# Patient Record
Sex: Male | Born: 2000 | Race: White | Hispanic: Yes | Marital: Single | Smoking: Never smoker
Health system: Southern US, Community
[De-identification: ages and names within clinical notes are randomized; demographics above are authoritative.]

## PROBLEM LIST (undated history)

## (undated) DIAGNOSIS — R030 Elevated blood-pressure reading, without diagnosis of hypertension: Secondary | ICD-10-CM

## (undated) DIAGNOSIS — J302 Other seasonal allergic rhinitis: Secondary | ICD-10-CM

## (undated) DIAGNOSIS — R079 Chest pain, unspecified: Secondary | ICD-10-CM

## (undated) DIAGNOSIS — M79603 Pain in arm, unspecified: Secondary | ICD-10-CM

## (undated) HISTORY — DX: Elevated blood-pressure reading, without diagnosis of hypertension: R03.0

## (undated) HISTORY — DX: Chest pain, unspecified: R07.9

## (undated) HISTORY — DX: Morbid (severe) obesity due to excess calories: E66.01

## (undated) HISTORY — DX: Pain in arm, unspecified: M79.603

---
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
## 2022-02-03 ENCOUNTER — Telehealth (INDEPENDENT_AMBULATORY_CARE_PROVIDER_SITE_OTHER): Payer: Self-pay | Admitting: Pediatrics

## 2022-02-03 NOTE — Telephone Encounter (Signed)
Who's calling (name and relationship to patient) : Sharlot Gowda; mom  Best contact number: [phone]  Provider they see: Dr. Quincy Sheehan  Reason for call: Mom wanted to know if Iasiah needs blood work done before the appt. She has requested a call back.   Call ID:      PRESCRIPTION REFILL ONLY  Name of prescription:  Pharmacy:

## 2022-02-04 ENCOUNTER — Ambulatory Visit (INDEPENDENT_AMBULATORY_CARE_PROVIDER_SITE_OTHER): Payer: Medicaid Other | Admitting: Pediatrics

## 2022-03-01 ENCOUNTER — Ambulatory Visit (HOSPITAL_COMMUNITY)
Admission: RE | Admit: 2022-03-01 | Discharge: 2022-03-01 | Disposition: A | Discharge status: Home / Self Care | Payer: Medicaid Other | Source: Ambulatory Visit | Attending: Pediatrics | Admitting: Pediatrics

## 2022-03-01 DIAGNOSIS — R0789 Other chest pain: Secondary | ICD-10-CM | POA: Insufficient documentation

## 2022-03-01 DIAGNOSIS — M7989 Other specified soft tissue disorders: Secondary | ICD-10-CM | POA: Insufficient documentation

## 2022-03-01 MED ORDER — GADOBUTROL 1 MMOL/ML IV SOLN
14.0000 mL | Freq: Once | INTRAVENOUS | Status: AC | PRN
  Administered 2022-03-01: 14 mL via INTRAVENOUS

## 2022-03-13 LAB — LIPID PANEL
Cholesterol: 154 mg/dL (ref <170)
HDL: 42 mg/dL — ABNORMAL LOW (ref >45)
LDL Cholesterol (Calc): 91 mg/dL (calc) (ref <110)
Non-HDL Cholesterol (Calc): 112 mg/dL (calc) (ref <120)
Total CHOL/HDL Ratio: 3.7 (calc) (ref <5.0)
Triglycerides: 112 mg/dL — ABNORMAL HIGH (ref <90)

## 2022-03-13 LAB — HEPATIC FUNCTION PANEL
AG Ratio: 1.7 (calc) (ref 1.0–2.5)
ALT: 56 U/L — ABNORMAL HIGH (ref 8–46)
AST: 26 U/L (ref 12–32)
Albumin: 4.3 g/dL (ref 3.6–5.1)
Alkaline phosphatase (APISO): 75 U/L (ref 46–169)
Bilirubin, Direct: 0.1 mg/dL (ref 0.0–0.2)
Globulin: 2.6 g/dL (calc) (ref 2.1–3.5)
Indirect Bilirubin: 0.3 mg/dL (calc) (ref 0.2–1.1)
Total Bilirubin: 0.4 mg/dL (ref 0.2–1.1)
Total Protein: 6.9 g/dL (ref 6.3–8.2)

## 2022-03-18 ENCOUNTER — Ambulatory Visit (INDEPENDENT_AMBULATORY_CARE_PROVIDER_SITE_OTHER): Payer: Medicaid Other | Admitting: Pediatrics

## 2022-03-18 ENCOUNTER — Encounter (INDEPENDENT_AMBULATORY_CARE_PROVIDER_SITE_OTHER): Payer: Self-pay | Admitting: Pediatrics

## 2022-03-18 VITALS — BP 128/80 | HR 96 | Wt 312.4 lb

## 2022-03-18 DIAGNOSIS — E781 Pure hyperglyceridemia: Secondary | ICD-10-CM | POA: Diagnosis not present

## 2022-03-18 DIAGNOSIS — R7401 Elevation of levels of liver transaminase levels: Secondary | ICD-10-CM | POA: Diagnosis not present

## 2022-03-18 NOTE — Patient Instructions (Addendum)
Latest Reference Range & Units 03/12/22 08:22  AG Ratio 1.0 - 2.5 (calc) 1.7  AST 12 - 32 U/L 26  ALT 8 - 46 U/L 56 (H)  Total Protein 6.3 - 8.2 g/dL 6.9  Bilirubin, Direct 0.0 - 0.2 mg/dL 0.1  Indirect Bilirubin 0.2 - 1.1 mg/dL (calc) 0.3  Total Bilirubin 0.2 - 1.1 mg/dL 0.4  Total CHOL/HDL Ratio <5.0 (calc) 3.7  Cholesterol <170 mg/dL 161  HDL Cholesterol >09 mg/dL 42 (L)  LDL Cholesterol (Calc) <110 mg/dL (calc) 91  Non-HDL Cholesterol (Calc) <120 mg/dL (calc) 604  Triglycerides <90 mg/dL 540 (H)  Alkaline phosphatase (APISO) 46 - 169 U/L 75  Globulin 2.1 - 3.5 g/dL (calc) 2.6  (H): Data is abnormally high (L): Data is abnormally low   Your ALT has decreased from 65 to 56 mg/dL, and HDL improved from 40 to 42. You are doing good. Keep working to normalize the ALT and normalize the Triglycerides.  If your next 6labs are normal at the next visit, you don't need to follow up with Dr. Quincy Sheehan.

## 2022-03-18 NOTE — Progress Notes (Signed)
Pediatric Endocrinology Consultation Follow-up Visit  [place] 2001-01-15 [id number]   HPI: Jeremy Woods  is a 21 y.o. male presenting for follow-up of mixed hyperlipidemia, elevated LFTs, and elevated BMI.  Jeremy Woods established care with this practice 04/23/21, and lifestyle changes were recommended. He last met with our dietician 11/10/21. He was unaccompanied and no interpretor was needed.  Jeremy Woods was last seen at [place] [place] on 08/04/21.  Since last visit, he has lost 5 pounds. 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   3. ROS: Greater than 10 systems reviewed with pertinent positives listed in HPI, otherwise neg.  The following portions of the patient's history were reviewed and updated as appropriate:  Past Medical History:   Past Medical History:  Diagnosis Date   Arm pain    Borderline systolic HTN    Chest pain    Morbidly obese (HCC)    Seasonal allergies     Meds: Outpatient Encounter Medications as of 03/18/2022  Medication Sig   [DISCONTINUED] calcium carbonate (TUMS - DOSED IN MG ELEMENTAL CALCIUM) 500 MG chewable tablet Chew 1 tablet by mouth daily.   [DISCONTINUED] cetirizine (ZYRTEC) 10 MG tablet Take 10 mg by mouth daily.   [DISCONTINUED] hydrocortisone cream 1 % apply to the affected area(s) by topical route 2 times per day for 7 days   [DISCONTINUED] metoprolol tartrate (LOPRESSOR) 100 MG tablet Take 1 tablet by mouth 2 hours before CT   [DISCONTINUED] naproxen (NAPROSYN) 375 MG tablet Take 375 mg by mouth 2 (two) times daily.   [DISCONTINUED] polyethylene glycol (MIRALAX / GLYCOLAX) 17 g packet dissolve as directed 1 packet and take by oral route daily as needed   No facility-administered encounter medications on file as of 03/18/2022.    Allergies: No Known Allergies  Surgical History: History reviewed. No pertinent surgical history.   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 Blood pressure %iles are not available for patients who are 18 years or older.  Wt Readings from Last 3 Encounters:  03/18/22 (!) 312 lb 6.4 oz (141.7 kg) (>99 %, Z= 3.17)*  11/10/21 (!) 317 lb 12.8 oz (144.2 kg) (>99 %, Z= 3.21)*  08/04/21 (!) 321 lb 6.4 oz (145.8 kg) (>99 %, Z= 3.24)*   * Growth percentiles are based on CDC (Boys, 2-20 Years) data.   Ht Readings from Last 3 Encounters:  11/10/21 6' 0.35" (1.838 m) (85 %, Z= 1.03)*  04/07/21 6' 0.24" (1.835 m) (85 %, Z= 1.03)*   * Growth percentiles are based on CDC (Boys, 2-20 Years) data.  Physical Exam Vitals reviewed.  Constitutional:      Appearance: Normal appearance.  HENT:     Head: Normocephalic and atraumatic.     Nose: Nose normal.     Mouth/Throat:     Mouth: Mucous membranes are moist.  Eyes:     Extraocular Movements: Extraocular movements intact.  Pulmonary:     Effort: Pulmonary effort is normal. No respiratory distress.  Abdominal:     General: There is no distension.  Musculoskeletal:        General: Normal range of motion.     Cervical back: Normal range of motion and neck supple.  Skin:    General: Skin is warm.     Findings: No rash.  Neurological:     General: No focal deficit present.      Mental Status: He is alert.  Psychiatric:        Mood and Affect: Mood normal.        Behavior: Behavior normal.      Labs: Results for orders placed or performed in visit on 11/20/21  ECHOCARDIOGRAM COMPLETE  Result Value Ref Range   Area-P 1/2 4.15 cm2   S' Lateral 3.00 cm    Assessment/Plan: Jeremy Woods is a 21 y.o. male with The primary encounter diagnosis was Elevated ALT measurement. Diagnoses of Hypertriglyceridemia and Morbid obesity ([place]) were also pertinent to this visit.   1. Elevated ALT measurement -likely secondary to NAFLD -improving with lifestyle changes -ALT improved from 65 to 56 - Comprehensive metabolic panel before next visit  2. Hypertriglyceridemia -stable, but triglycerides slightly higher than last check -HDL is improving - Fasting Lipid panel before next visit  3. Morbid obesity ([place]) -again lost weight  -BMI improved from 45 to 42 -encouraged to keep up the good progress   Orders Placed This Encounter  Procedures   Lipid panel   Comprehensive metabolic panel    No orders of the defined types were placed in this encounter.     Follow-up:   Return in about 6 months (around 09/16/2022), or if symptoms worsen or fail to improve, for follow up and review of labs. If next set of labs normal, will return him to the care of his pediatrician.  Medical decision-making:  I spent 30 minutes dedicated to the care of this patient on the date of this encounter to include pre-visit review of labs/imaging/other provider notes, medically appropriate exam, face-to-face time with the patient, ordering of testing, and documenting in the EHR.   Thank you for the opportunity to participate in the care of your patient. Please do not hesitate to contact me should you have any questions regarding the assessment or treatment plan.   Sincerely,   Al Corpus, MD

## 2022-04-27 NOTE — Progress Notes (Incomplete)
   Medical Nutrition Therapy - Progress Note Appt start time: *** Appt end time: *** Reason for referral: Obesity Referring provider: Dr. Leana Roe - Endo Pertinent medical hx: acanthosis nigricans, obesity, vitamin D deficiency, hyperlipidemia, elevated ALT measurement  Assessment: Food allergies: none Pertinent Medications: see medication list  Vitamins/Supplements: none Pertinent labs:  (9/7) Hepatic Function Panel:ALT - 56 (high) (9/7) Lipid Panel: HDL - 42 (low), TG - 112 (high) (4/3) BMP, CBC - WNL (10/3) POCT Glucose: 108 (high) (10/3) POCT Hgb A1c: 5.2 (WNL) (10/4) HDL: 34 (low), TG: 120 (high), Cholesterol: 137 (WNL) (10/4) AST: 40 (high), ALT: 115 (high) (10/4) Vitamin D: 19 (deficient)  No anthropometrics taken on *** to prevent focus on weight for appointment. Most recent anthropometrics 6/2 were used to determine dietary needs.   (6/2) Anthropometrics: The child was weighed, measured, and plotted on the CDC growth chart. Ht: 179 cm (63.69 %)  Z-score: 0.35 Wt: 143.8 kg (99.93 %) Z-score: 3.20 BMI: 44.8 (99.87 %)  Z-score: 3.02   152% of 95th% IBW based on BMI @ 85th%: 83.3 kg  Estimated minimum caloric needs: 20 kcal/kg/day (DRI x IBW) Estimated minimum protein needs: 0.8 g/kg/day (DRI) Estimated minimum fluid needs: 19 mL/kg/day (Holliday Segar based on IBW)  Primary concerns today: Follow-up given pt with obesity. *** accompanied pt to appt today.  Dietary Intake Hx:  Current feeding behaviors: scheduled meals/snacks  Usual eating pattern includes: 3 meals and 1-2 snacks per day.  Snacking after bed: None Sneaking food: none Meal location: kitchen table Family meals: breakfast, dinner together Is everyone served the same meal: yes  Electronics present at meal times: yes, phones Preferred foods: chicken, steak, fruits, broccoli Avoided foods: oatmeal, strawberries Fast-food/eating out: 1-2x/week ([place] - honey spicy chicken sandwich; [employer]; [place] food)  24-hr recall:  Breakfast: Snack:  Lunch: Snack:  Dinner: Snack:  Typical Snacks: apples, oranges, fruit *** Typical Beverages: water, fat-free milk, rarely juice (6 oz), soda (1x/week), coffee w/ 1/4 cup creamer (daily) ***  Notes: ***  Changes made: *** Cut out ranch dressing  Decreased bread consumption  Decrease sugar sweetened beverages (juice, lemonade) - previously drinking 2 glasses per day  Stopped eating after dinner Decreased consumption of sugary foods  Eating more consistently - no meal skipping  Physical Activity: workout (cardio and weights) 4x/week, walking daily (30-45 minutes) ***  GI: constipation, some improvement - taking Miralax   Estimated current intake likely exceeding needs given obesity status. ***  Pt consuming various food groups. Pt likely consuming inadequate amounts of fruits and vegetables. ***  Nutrition Diagnosis: (***) Class 3 Obesity related to hx of excess caloric consumption as evidenced by BMI 152% of 95th percentile. (***) Altered nutrition-related laboratory values (POCT Glucose, HDL, TG, ALT) related to hx of excessive energy intake and lack of physical activity as evidenced by lab values above. ***  Intervention: Discussed pt's current intake. Discussed recommendations below. All questions answered, family in agreement with plan.   Nutrition Recommendations: - ***  Keep up the good work!   Handouts Given:  - ***  Handouts Given at Previous Appointments: - Heart Healthy MyPlate Planner  - Hand Serving Size  - Carbohydrates vs Noncarbohydrates - GG Healthy Recipes  - Types of Fats  Teach back method used.  Monitoring/Evaluation: Continue to Monitor: - Growth trends - Dietary intake - Physical activity - Lab values  Follow-up in ***.  Total time spent in counseling: *** minutes.

## 2022-05-11 ENCOUNTER — Ambulatory Visit (INDEPENDENT_AMBULATORY_CARE_PROVIDER_SITE_OTHER): Payer: Medicaid Other | Admitting: Dietician

## 2022-09-15 NOTE — Progress Notes (Unsigned)
Pediatric Endocrinology Consultation Follow-up Visit  [place] 2000-11-01 [id number]   HPI: Jeremy Woods  is a 22 y.o. male presenting for follow-up of mixed hyperlipidemia, elevated LFTs, and elevated BMI.  Jeremy Woods established care with this practice 04/23/21, and lifestyle changes were recommended. Jeremy Woods last met with our dietician 11/10/21. Jeremy Woods was unaccompanied and no interpretor was needed.  Jeremy Woods was last seen at [facility] on 03/18/22.  Since last visit, Jeremy Woods has gained 13 pounds.   ROS: Greater than 10 systems reviewed with pertinent positives listed in HPI, otherwise neg.  The following portions of the patient's history were reviewed and updated as appropriate:  Past Medical History:   Past Medical History:  Diagnosis Date  . Arm pain   . Borderline systolic HTN   . Chest pain   . Morbidly obese ([place])   . Seasonal allergies     Meds: Outpatient Encounter Medications as of 09/16/2022  Medication Sig  . ibuprofen (ADVIL) 600 MG tablet Take 600 mg by mouth every 6 (six) hours as needed. (Patient not taking: Reported on 09/16/2022)   No facility-administered encounter medications on file as of 09/16/2022.    Allergies: No Known Allergies  Surgical History: History reviewed. No pertinent surgical history.   Family History:  Family History  Problem Relation Age of Onset  . Healthy Mother   . Hypertension Maternal Grandmother   . Thyroid disease Maternal Grandmother   . Hypertension Maternal Grandfather   . Diabetes Paternal Grandmother   . 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 Blood pressure %iles are not available for patients who are 18 years or older.  Wt Readings from Last 3 Encounters:  09/16/22 (!) 325 lb 6.4 oz (147.6 kg) (>99 %, Z= 3.32)*  03/18/22 (!) 312 lb 6.4 oz (141.7 kg) (>99 %, Z= 3.17)*  11/10/21 (!) 317 lb 12.8 oz (144.2 kg) (>99 %, Z= 3.21)*   * Growth percentiles are based on CDC (Boys, 2-20 Years) data.   Ht Readings from Last 3 Encounters:  11/10/21 6' 0.35" (1.838 m) (85 %, Z= 1.03)*  04/07/21 6' 0.24" (1.835 m) (85 %, Z= 1.03)*   * Growth percentiles are based on CDC (Boys, 2-20 Years) data.    Physical Exam Vitals reviewed.  Constitutional:      Appearance: Normal appearance.  HENT:     Head: Normocephalic and atraumatic.     Nose: Nose normal.     Mouth/Throat:     Mouth: Mucous membranes are moist.  Eyes:     Extraocular Movements: Extraocular movements intact.     Comments: glasses  Pulmonary:     Effort: Pulmonary effort is normal. No respiratory distress.  Abdominal:     General: There is no distension.  Musculoskeletal:        General: Normal range of motion.     Cervical back: Normal range of motion and neck supple.  Skin:    General: Skin is warm.     Findings: No rash.     Comments: acanthosis  Neurological:     General: No focal deficit present.     Mental Status: Jeremy Woods is alert.  Psychiatric:        Mood and Affect: Mood normal.        Behavior: Behavior normal.     Labs: Results for orders placed or performed in visit on 03/18/22  Lipid panel  Result Value Ref Range   Cholesterol 151 <170 mg/dL   HDL 40 (L) >45 mg/dL   Triglycerides 142 (H) <90 mg/dL   LDL Cholesterol (Calc) 87 <110 mg/dL (calc)   Total CHOL/HDL Ratio 3.8 <5.0 (calc)   Non-HDL Cholesterol (Calc) 111 <120 mg/dL (calc)  Comprehensive metabolic panel  Result Value Ref Range   Glucose, Bld 92 65 - 99 mg/dL   BUN 13 7 - 20 mg/dL   Creat 0.93 0.60 - 1.24 mg/dL    BUN/Creatinine Ratio SEE NOTE: 6 - 22 (calc)   Sodium 142 135 - 146 mmol/L   Potassium 4.1 3.8 - 5.1 mmol/L   Chloride 106 98 - 110 mmol/L   CO2 26 20 - 32 mmol/L   Calcium 9.7 8.9 - 10.4 mg/dL   Total Protein 7.1 6.3 - 8.2 g/dL   Albumin 4.4 3.6 - 5.1 g/dL   Globulin 2.7 2.1 - 3.5 g/dL (calc)   AG Ratio 1.6 1.0 - 2.5 (calc)   Total Bilirubin 0.7 0.2 - 1.1 mg/dL   Alkaline phosphatase (APISO) 72 46 - 169 U/L   AST 26 12 - 32 U/L   ALT 62 (H) 8 - 46 U/L   Imaging: Chest mass and MRI showed, "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  Assessment/Plan: Jeremy Woods is a 22 y.o. male with The primary encounter diagnosis was Metabolic syndrome. Diagnoses of Hypertriglyceridemia, Elevated ALT measurement, Morbid obesity ([place]), and Counseling for transition from pediatric to adult care provider were also pertinent to this visit.  Jeremy Woods was seen today for elevated alt measurement.  Metabolic syndrome Assessment & Plan: -hypertriglyceridemia + acanthosis + elevated ALT + Obesity -inc risk of heart disease   Hypertriglyceridemia Assessment & Plan: -worsening as Triglycerides have risen secondary to poor lifestyle choices -LDL also decreased due to lack of activity -recommended lifestyle changes (see AVS) -rec fasting lipid panel in 6 months   Elevated ALT measurement Assessment & Plan: -ALT is rising -concern of worsening NAFLD -recommended lifestyle changes   Morbid obesity ([place]) Assessment & Plan: -rising BMI -lifestyle changes recommended   Counseling for transition from pediatric to adult care provider    Patient Instructions    Latest Reference Range & Units 03/12/22 08:22 09/15/22 08:12  AST 12 - 32 U/L 26 26  ALT 8 - 46 U/L 56 (H) 62 (H)  (H): Data is abnormally high   Latest Reference Range & Units 03/12/22 08:22 09/15/22 08:12  Total CHOL/HDL Ratio <5.0 (calc) 3.7 3.8  Cholesterol <170 mg/dL 154 151  HDL  Cholesterol >45 mg/dL 42 (L) 40 (L)  LDL Cholesterol (Calc) <110 mg/dL (calc) 91 87  Non-HDL Cholesterol (Calc) <120 mg/dL (calc) 112 111  Triglycerides <90 mg/dL 112 (H) 142 (H)  (L): Data is abnormally low (H): Data is abnormally high  Recommendations for healthy eating  Never skip breakfast. Try to have at least 10 grams of protein (glass of milk, eggs, shake, or breakfast bar). No soda, juice, or  sweetened drinks. Try sugar-free creamer or plain milk Limit starches/carbohydrates to 1 fist per meal at breakfast, lunch and dinner. No eating after dinner. Eat three meals per day and dinner should be with the family. Limit of one snack daily, after school. All snacks should be a fruit or vegetables without dressing. Avoid bananas/grapes. Low carb fruits: berries, green apple, cantaloupe, honeydew No breaded or fried foods. Increase water intake, drink ice cold water 8 to 10 ounces before eating. Exercise daily for 30 to 60 minutes.  For insomnia or inability to stay asleep at night: Sleep App: Insomnia Coach  Meditate: Headspace on Netflix has guided meditation or Youtube Apps: Calm or Headspace have guided meditation      [email or web address]     It was good seeing you today. Please work on making healthier choices as we discussed. Above are some reminders and helpful handouts. I would like your PCP to repeat fasting labs to follow your cholesterol levels, liver enzymes and HbA1c to screen you for diabetes in 6 months.   Since you will be 22 in 6 months, I recommend following up with an adult provider at your primary care office.          Follow-up:   Return Transition to adult provider.   Medical decision-making:  I have personally spent 40 minutes involved in face-to-face and non-face-to-face activities for this patient on the day of the visit. Professional time spent includes the following activities, in addition to those noted in the documentation:  preparation time/chart review, ordering of medications/tests/procedures, obtaining and/or reviewing separately obtained history, counseling and educating the patient/family/caregiver, performing a medically appropriate examination and/or evaluation, referring and communicating with other health care professionals for care coordination, and documentation in the EHR.    Thank you for the opportunity to participate in the care of your patient. Please do not hesitate to contact me should you have any questions regarding the assessment or treatment plan.   Sincerely,   Al Corpus, MD

## 2022-09-16 ENCOUNTER — Encounter (INDEPENDENT_AMBULATORY_CARE_PROVIDER_SITE_OTHER): Payer: Self-pay | Admitting: Pediatrics

## 2022-09-16 ENCOUNTER — Ambulatory Visit (INDEPENDENT_AMBULATORY_CARE_PROVIDER_SITE_OTHER): Payer: Medicaid Other | Admitting: Pediatrics

## 2022-09-16 VITALS — BP 124/78 | HR 88 | Wt 325.4 lb

## 2022-09-16 DIAGNOSIS — E781 Pure hyperglyceridemia: Secondary | ICD-10-CM

## 2022-09-16 DIAGNOSIS — Z7187 Encounter for pediatric-to-adult transition counseling: Secondary | ICD-10-CM

## 2022-09-16 DIAGNOSIS — R7401 Elevation of levels of liver transaminase levels: Secondary | ICD-10-CM | POA: Diagnosis not present

## 2022-09-16 DIAGNOSIS — E8881 Metabolic syndrome: Secondary | ICD-10-CM | POA: Diagnosis not present

## 2022-09-16 LAB — COMPREHENSIVE METABOLIC PANEL
AG Ratio: 1.6 (calc) (ref 1.0–2.5)
ALT: 62 U/L — ABNORMAL HIGH (ref 8–46)
AST: 26 U/L (ref 12–32)
Albumin: 4.4 g/dL (ref 3.6–5.1)
Alkaline phosphatase (APISO): 72 U/L (ref 46–169)
BUN: 13 mg/dL (ref 7–20)
CO2: 26 mmol/L (ref 20–32)
Calcium: 9.7 mg/dL (ref 8.9–10.4)
Chloride: 106 mmol/L (ref 98–110)
Creat: 0.93 mg/dL (ref 0.60–1.24)
Globulin: 2.7 g/dL (calc) (ref 2.1–3.5)
Glucose, Bld: 92 mg/dL (ref 65–99)
Potassium: 4.1 mmol/L (ref 3.8–5.1)
Sodium: 142 mmol/L (ref 135–146)
Total Bilirubin: 0.7 mg/dL (ref 0.2–1.1)
Total Protein: 7.1 g/dL (ref 6.3–8.2)

## 2022-09-16 LAB — LIPID PANEL
Cholesterol: 151 mg/dL (ref <170)
HDL: 40 mg/dL — ABNORMAL LOW (ref >45)
LDL Cholesterol (Calc): 87 mg/dL (calc) (ref <110)
Non-HDL Cholesterol (Calc): 111 mg/dL (calc) (ref <120)
Total CHOL/HDL Ratio: 3.8 (calc) (ref <5.0)
Triglycerides: 142 mg/dL — ABNORMAL HIGH (ref <90)

## 2022-09-16 NOTE — Assessment & Plan Note (Signed)
-  worsening as Triglycerides have risen secondary to poor lifestyle choices -LDL also decreased due to lack of activity -recommended lifestyle changes (see AVS) -rec fasting lipid panel in 6 months

## 2022-09-16 NOTE — Assessment & Plan Note (Signed)
-  hypertriglyceridemia + acanthosis + elevated ALT + Obesity -inc risk of heart disease

## 2022-09-16 NOTE — Assessment & Plan Note (Signed)
-  ALT is rising -concern of worsening NAFLD -recommended lifestyle changes

## 2022-09-16 NOTE — Patient Instructions (Addendum)
Latest Reference Range & Units 03/12/22 08:22 09/15/22 08:12  AST 12 - 32 U/L 26 26  ALT 8 - 46 U/L 56 (H) 62 (H)  (H): Data is abnormally high   Latest Reference Range & Units 03/12/22 08:22 09/15/22 08:12  Total CHOL/HDL Ratio <5.0 (calc) 3.7 3.8  Cholesterol <170 mg/dL 154 151  HDL Cholesterol >45 mg/dL 42 (L) 40 (L)  LDL Cholesterol (Calc) <110 mg/dL (calc) 91 87  Non-HDL Cholesterol (Calc) <120 mg/dL (calc) 112 111  Triglycerides <90 mg/dL 112 (H) 142 (H)  (L): Data is abnormally low (H): Data is abnormally high  Recommendations for healthy eating  Never skip breakfast. Try to have at least 10 grams of protein (glass of milk, eggs, shake, or breakfast bar). No soda, juice, or sweetened drinks. Try sugar-free creamer or plain milk Limit starches/carbohydrates to 1 fist per meal at breakfast, lunch and dinner. No eating after dinner. Eat three meals per day and dinner should be with the family. Limit of one snack daily, after school. All snacks should be a fruit or vegetables without dressing. Avoid bananas/grapes. Low carb fruits: berries, green apple, cantaloupe, honeydew No breaded or fried foods. Increase water intake, drink ice cold water 8 to 10 ounces before eating. Exercise daily for 30 to 60 minutes.  For insomnia or inability to stay asleep at night: Sleep App: Insomnia Coach  Meditate: Headspace on Netflix has guided meditation or Youtube Apps: Calm or Headspace have guided meditation      [email or web address]     It was good seeing you today. Please work on making healthier choices as we discussed. Above are some reminders and helpful handouts. I would like your PCP to repeat fasting labs to follow your cholesterol levels, liver enzymes and HbA1c to screen you for diabetes in 6 months.   Since you will be 22 in 6 months, I recommend following up with an adult provider at your primary care office.

## 2022-09-16 NOTE — Assessment & Plan Note (Signed)
-  rising BMI -lifestyle changes recommended

## 2023-01-21 IMAGING — CR DG CHEST 2V
2 series · 2 of 2 positions shown · non-contrast
Comparison: 08/21/2016

CLINICAL DATA: Chest pain

EXAM:
CHEST - 2 VIEW

[chest pa]
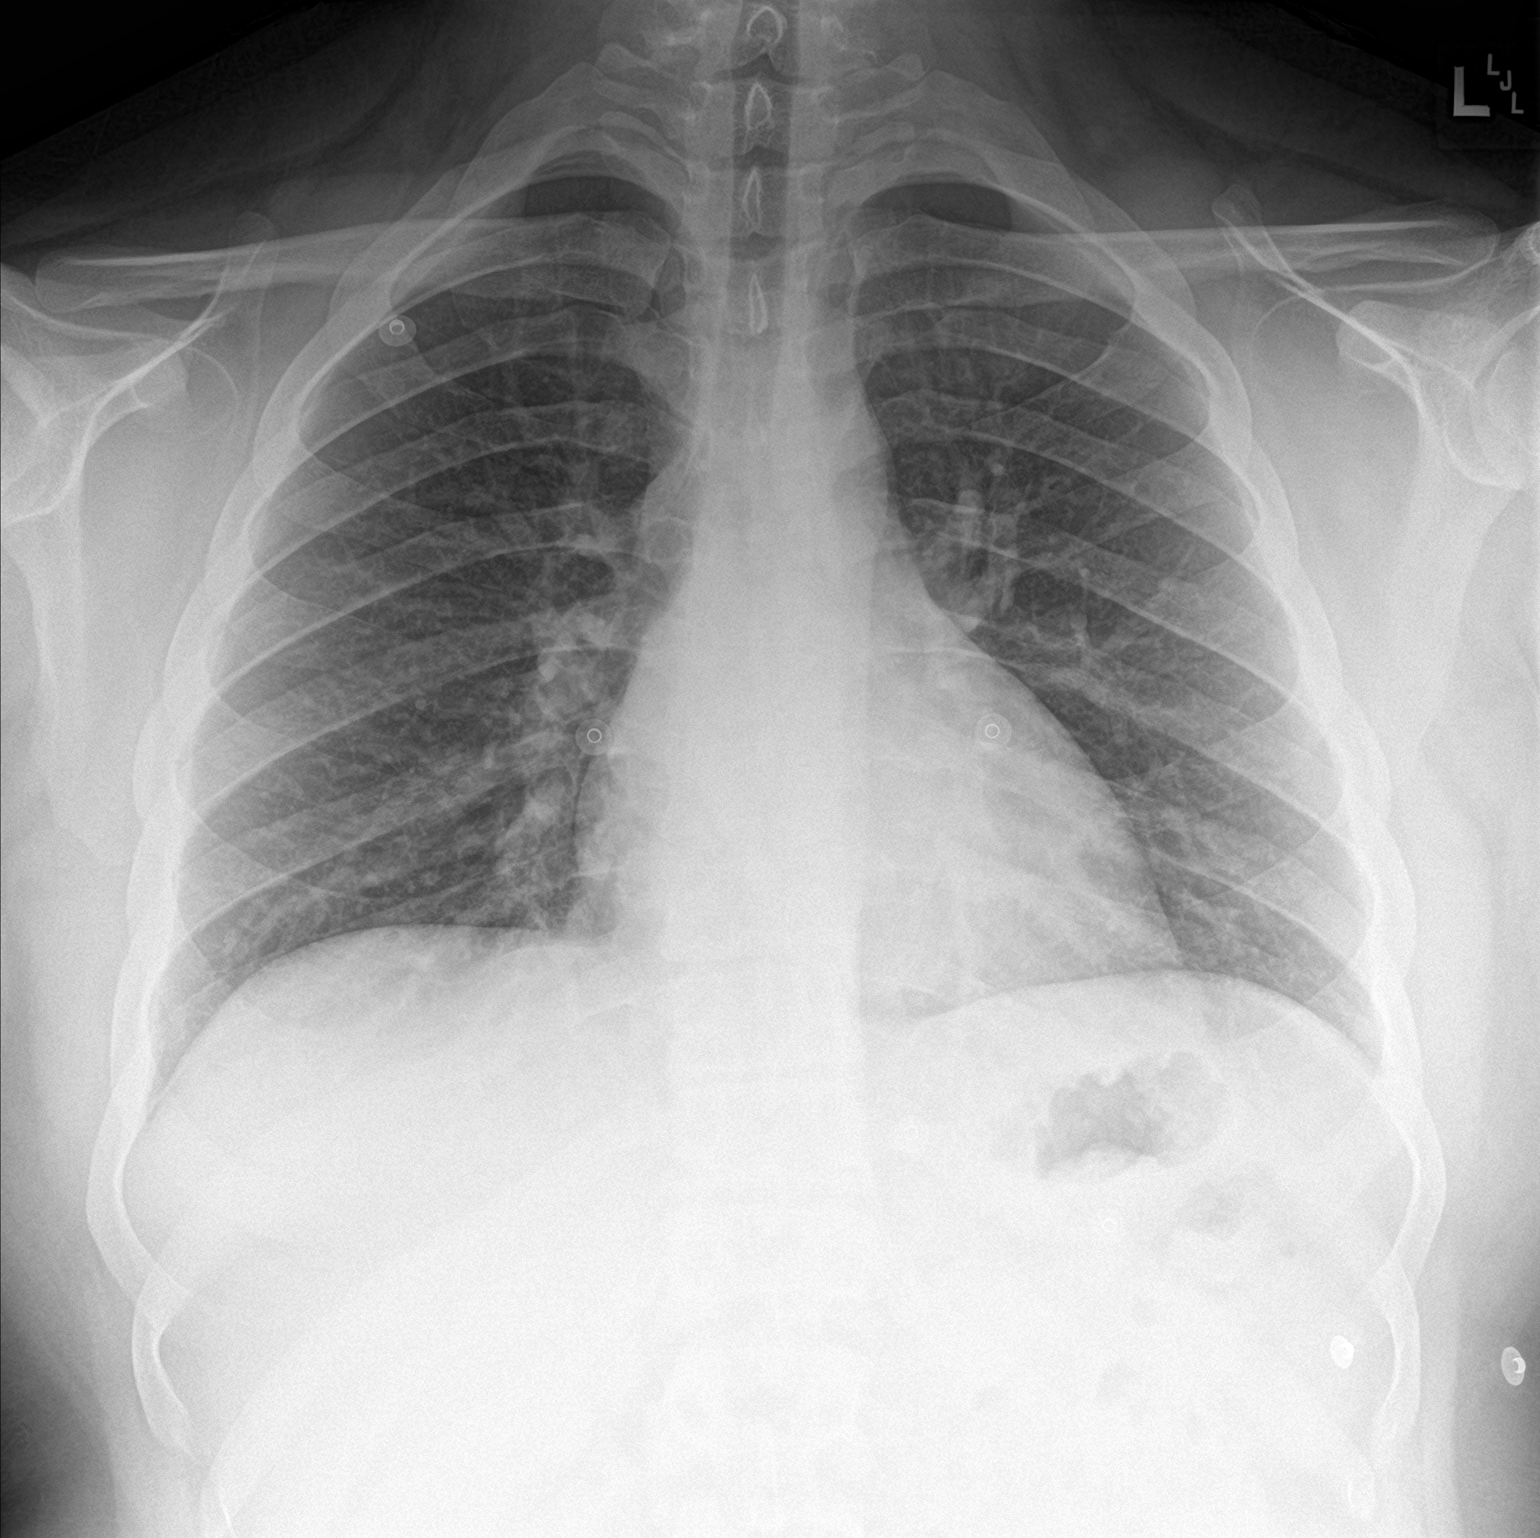

[chest lat]
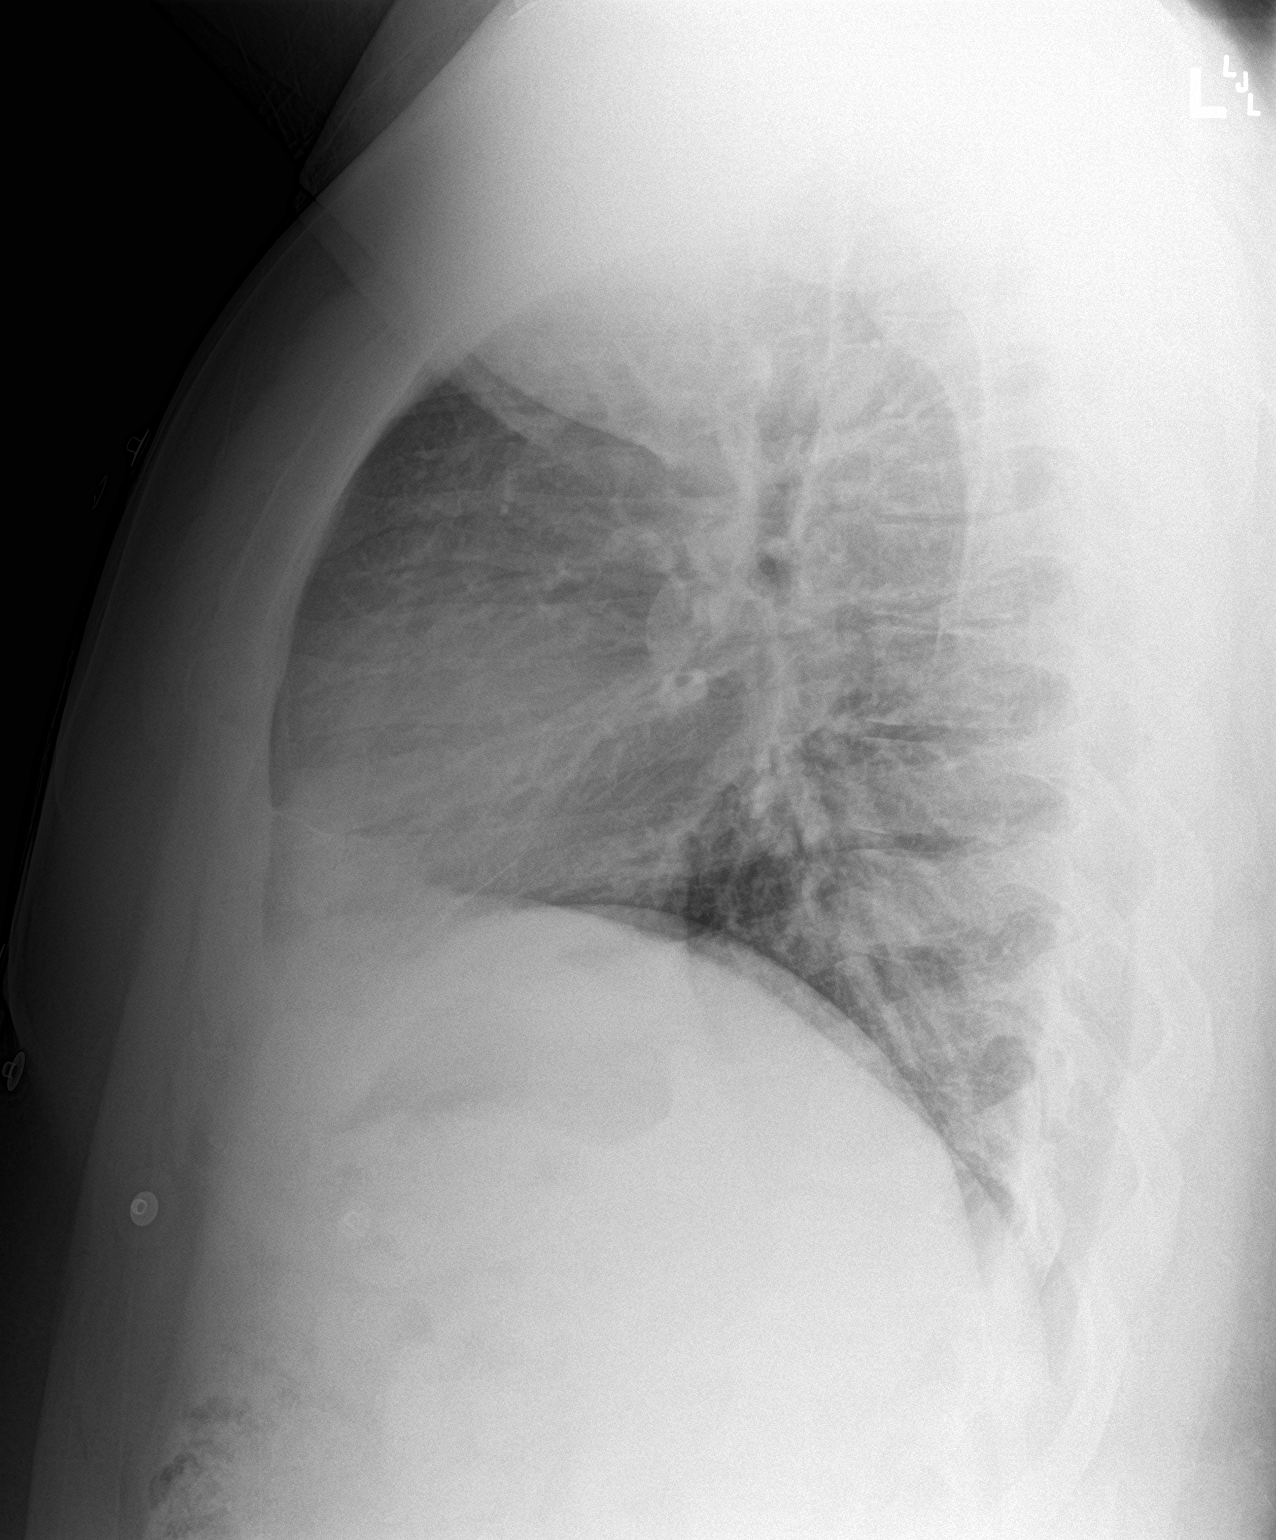

[2 of 2 positions shown; findings below may reference images not displayed]

FINDINGS: The heart size and mediastinal contours are within normal limits.
Both lungs are clear. The visualized skeletal structures are
unremarkable.
IMPRESSION: No active cardiopulmonary disease.

## 2023-03-13 IMAGING — CT CT HEART MORP W/ CTA COR W/ SCORE W/ CA W/CM &/OR W/O CM
4 of 7 series · 8 of 20 positions shown, 9 images · non-contrast
Comparison: None.

Addendum:
CLINICAL DATA: 20 Year old Hispanic male

EXAM:
Cardiac/Coronary  CTA
TECHNIQUE: The patient was scanned on a [employer] Force scanner.

[Series 6: ts diast sharp · axial · 0.39mm/px · z∈[+103,+141]mm · 2 of 286 slices shown]
[im 96/286  lung]
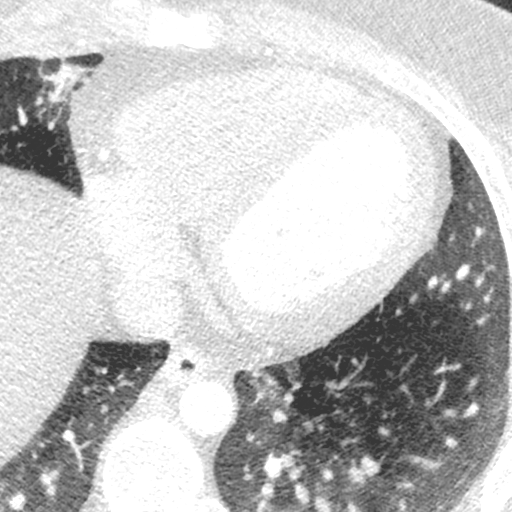
[im 191/286  lung]
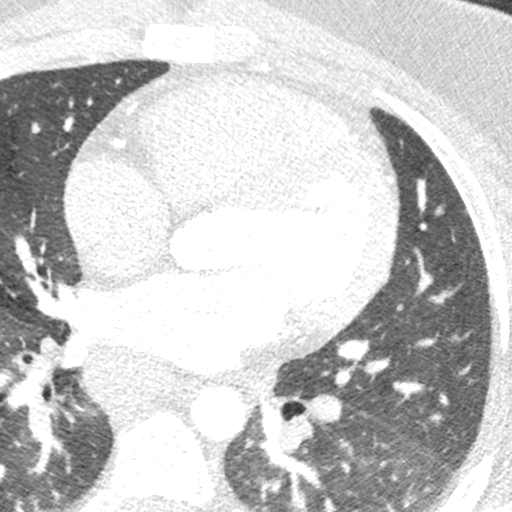

[Series 7: ts syst sharp · axial · 0.39mm/px · z∈[+103,+141]mm · 2 of 286 slices shown]
[im 96/286  lung]
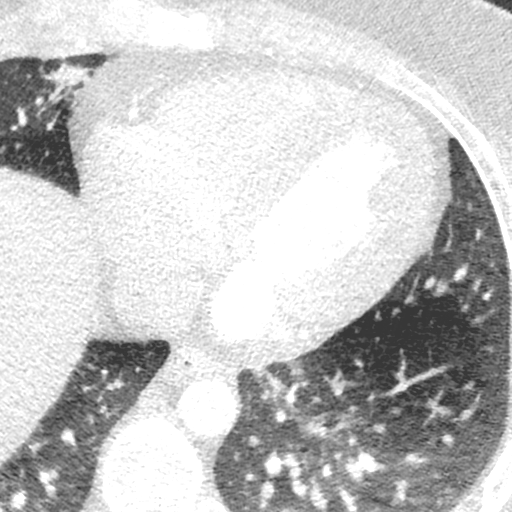
[im 191/286  lung]
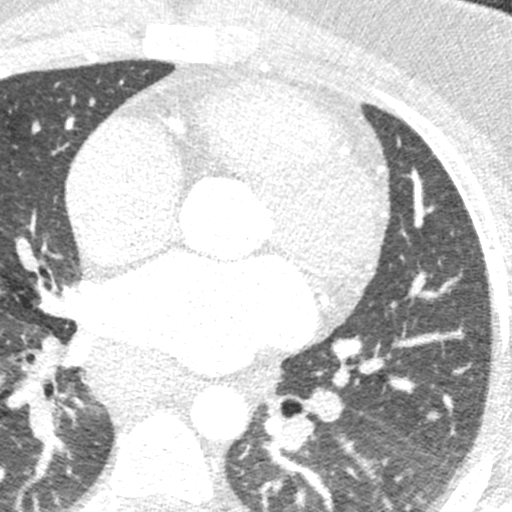

[Series 8: best syst · axial · 0.39mm/px · z∈[+103,+141]mm · 2 of 286 slices shown, 3 images]
[im 96/286  vessel]
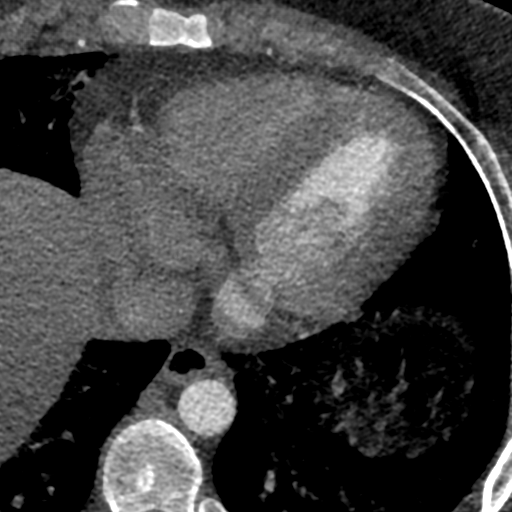
[im 96/286  lung]
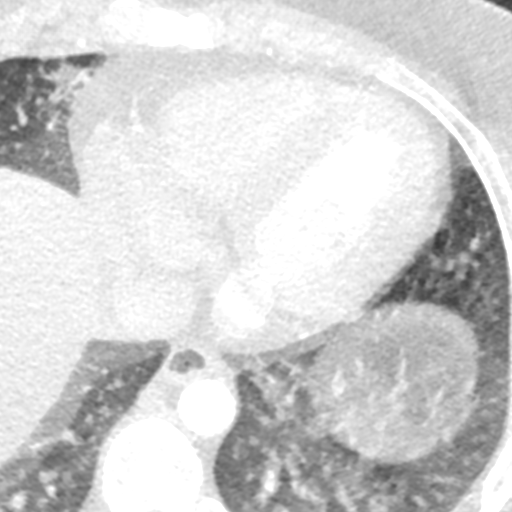
[im 191/286  vessel]
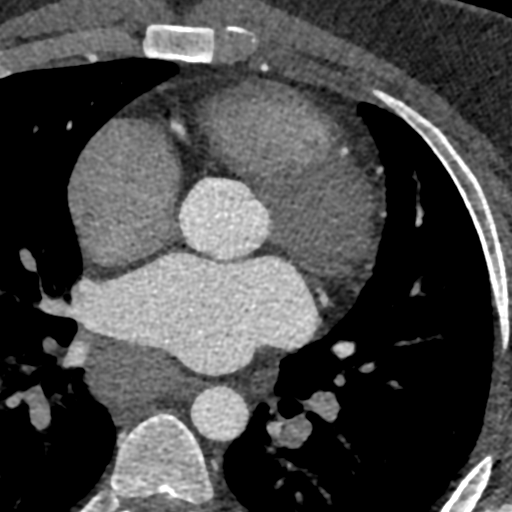

[Series 9: best diast · axial · 0.39mm/px · z∈[+103,+141]mm · 2 of 286 slices shown]
[im 96/286  vessel]
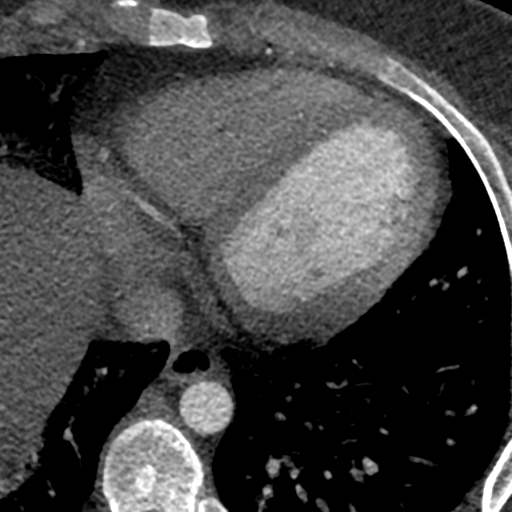
[im 191/286  vessel]
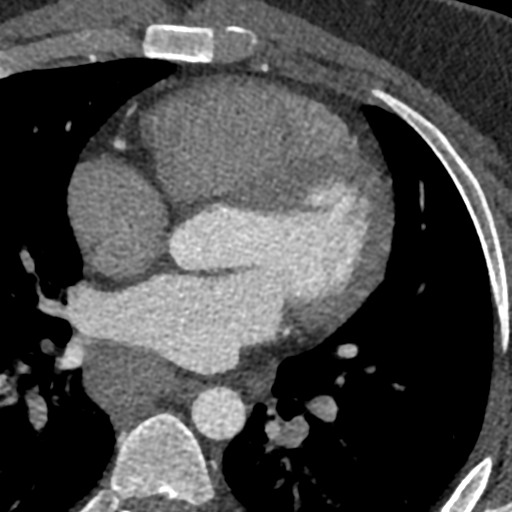

[8 of 20 positions shown; findings below may reference images not displayed]

FINDINGS: Scan was triggered in the descending thoracic aorta. Axial
non-contrast 3 mm slices were carried out through the heart. The
data set was analyzed on a dedicated work station and scored using
the Agatson method. Gantry rotation speed was 250 msecs and
collimation was .6 mm. 0.8 mg of sl NTG was given. The 3D data set
was reconstructed in 5% intervals of the 67-82 % of the R-R cycle.
Diastolic phases were analyzed on a dedicated work station using
MPR, MIP and VRT modes. The patient received 100 of contrast.

Coronary Arteries:  Normal coronary origin.  Right dominance.

Coronary Calcium Score:

Left main: 0

Left anterior descending artery: 0

Left circumflex artery: 0

Right coronary artery: 0

Total: 0

Percentile: 1st for age, sex, and race matched control.

RCA is a large dominant artery that gives rise to PDA and PLA. Mild
luminal narrowing mid RCA without discrete plaque.

Left main is a large artery that gives rise to LAD and LCX arteries.
There is no significant plaque.

LAD is a large vessel that gives rise to one large D1 Branch. There
is no significant plaque.

LCX is a non-dominant artery that gives rise to one large OM1
branch. There is no significant plaque.

Other findings:

Aorta: Normal size.  No calcifications.  No dissection.

Main Pulmonary Artery: Normal size of the pulmonary artery.

Aortic Valve:  Tri-leaflet.  No calcifications.

Normal pulmonary vein drainage into the left atrium.

Normal left atrial appendage without a thrombus.

Interatrial septum with no communications.

Extra-cardiac findings: See attached radiology report for
non-cardiac structures.

Artifact: Cardiac motion artifact.
IMPRESSION: 1. Coronary calcium score of 0. This was 1st percentile for age,
sex, and race matched control.

2. Normal coronary origin with right dominance.

3. CAD-RADS 2. Mild non-obstructive CAD (25-49%). Consider
non-atherosclerotic causes of chest pain. Consider preventive
therapy and risk factor modification.

RECOMMENDATIONS:



If CAC = 0, it is reasonable to withhold statin therapy and reassess
in 5 to 10 years, as long as higher risk conditions are absent
(diabetes mellitus, family history of premature CHD in first degree
relatives (males <55 years; females <65 years), cigarette smoking,
LDL >=190 mg/dL or other independent risk factors).

If CAC is 1 to 99, it is reasonable to initiate statin therapy for
patients >=55 years of age.

If CAC is >=100 or >=75th percentile, it is reasonable to initiate
statin therapy at any age.

Cardiology referral should be considered for patients with CAC
scores =400 or >=75th percentile.

*5197 AHA/ACC/AACVPR/AAPA/ABC/MARIALIMA/SKEPH/AMAZIGH/Rebbestad/KANU/PIGOTT/HALLMAN
Guideline on the Management of Blood Cholesterol: A Report of the
American College of Cardiology/American Heart Association Task Force
on Clinical Practice Guidelines. J Am Coll Cardiol.
7464;73(24):5145-5416.

EXAM:
OVER-READ INTERPRETATION  CT CHEST

The following report is an over-read performed by radiologist Dr.
over-read does not include interpretation of cardiac or coronary
anatomy or pathology. The coronary CTA interpretation by the
cardiologist is attached.
FINDINGS: Vascular: Normal heart size. No pericardial effusion. Normal caliber
thoracic aorta with no significant atherosclerotic disease. No
suspicious filling defects of the central pulmonary arteries.

Mediastinum/Nodes: Esophagus is unremarkable. Subcarinal mass
measuring 4.7 x 3.6 cm on series 10 image 10. mild soft tissue of
the anterior mediastinum, likely normal thymus.

Lungs/Pleura: Lungs are clear. No pleural effusion or pneumothorax.

Upper Abdomen: No acute abnormality.

Musculoskeletal: No chest wall mass or suspicious bone lesions
identified.
IMPRESSION: Subcarinal mass measuring up to 4.7 cm, differential considerations
include duplication cyst or lymphoproliferative disease. Recommend
contrast-enhanced MRI of the chest for further evaluation.

These results will be called to the ordering clinician or
representative by the Radiologist Assistant, and communication
documented in the PACS or [REDACTED].

*** End of Addendum ***
FINDINGS: Scan was triggered in the descending thoracic aorta. Axial
non-contrast 3 mm slices were carried out through the heart. The
data set was analyzed on a dedicated work station and scored using
the Agatson method. Gantry rotation speed was 250 msecs and
collimation was .6 mm. 0.8 mg of sl NTG was given. The 3D data set
was reconstructed in 5% intervals of the 67-82 % of the R-R cycle.
Diastolic phases were analyzed on a dedicated work station using
MPR, MIP and VRT modes. The patient received 100 of contrast.

Coronary Arteries:  Normal coronary origin.  Right dominance.

Coronary Calcium Score:

Left main: 0

Left anterior descending artery: 0

Left circumflex artery: 0

Right coronary artery: 0

Total: 0

Percentile: 1st for age, sex, and race matched control.

RCA is a large dominant artery that gives rise to PDA and PLA. Mild
luminal narrowing mid RCA without discrete plaque.

Left main is a large artery that gives rise to LAD and LCX arteries.
There is no significant plaque.

LAD is a large vessel that gives rise to one large D1 Branch. There
is no significant plaque.

LCX is a non-dominant artery that gives rise to one large OM1
branch. There is no significant plaque.

Other findings:

Aorta: Normal size.  No calcifications.  No dissection.

Main Pulmonary Artery: Normal size of the pulmonary artery.

Aortic Valve:  Tri-leaflet.  No calcifications.

Normal pulmonary vein drainage into the left atrium.

Normal left atrial appendage without a thrombus.

Interatrial septum with no communications.

Extra-cardiac findings: See attached radiology report for
non-cardiac structures.

Artifact: Cardiac motion artifact.
IMPRESSION: 1. Coronary calcium score of 0. This was 1st percentile for age,
sex, and race matched control.

2. Normal coronary origin with right dominance.

3. CAD-RADS 2. Mild non-obstructive CAD (25-49%). Consider
non-atherosclerotic causes of chest pain. Consider preventive
therapy and risk factor modification.

RECOMMENDATIONS:



If CAC = 0, it is reasonable to withhold statin therapy and reassess
in 5 to 10 years, as long as higher risk conditions are absent
(diabetes mellitus, family history of premature CHD in first degree
relatives (males <55 years; females <65 years), cigarette smoking,
LDL >=190 mg/dL or other independent risk factors).

If CAC is 1 to 99, it is reasonable to initiate statin therapy for
patients >=55 years of age.

If CAC is >=100 or >=75th percentile, it is reasonable to initiate
statin therapy at any age.

Cardiology referral should be considered for patients with CAC
scores =400 or >=75th percentile.

*5197 AHA/ACC/AACVPR/AAPA/ABC/MARIALIMA/SKEPH/AMAZIGH/Rebbestad/KANU/PIGOTT/HALLMAN
Guideline on the Management of Blood Cholesterol: A Report of the
American College of Cardiology/American Heart Association Task Force
on Clinical Practice Guidelines. J Am Coll Cardiol.
7464;73(24):5145-5416.
